# Patient Record
Sex: Female | Born: 1945 | Race: White | Hispanic: No | Marital: Single | Smoking: Current every day smoker
Health system: Southern US, Community
[De-identification: ages and names within clinical notes are randomized; demographics above are authoritative.]

## PROBLEM LIST (undated history)

## (undated) DIAGNOSIS — I10 Essential (primary) hypertension: Secondary | ICD-10-CM

## (undated) HISTORY — PX: TONSILLECTOMY: SUR1361

---

## 1976-09-28 DIAGNOSIS — G459 Transient cerebral ischemic attack, unspecified: Secondary | ICD-10-CM

## 1976-09-28 HISTORY — DX: Transient cerebral ischemic attack, unspecified: G45.9

## 2020-04-21 ENCOUNTER — Other Ambulatory Visit: Payer: Self-pay | Admitting: Infectious Diseases

## 2020-04-21 ENCOUNTER — Other Ambulatory Visit: Payer: Self-pay

## 2020-04-21 ENCOUNTER — Ambulatory Visit
Admission: RE | Admit: 2020-04-21 | Discharge: 2020-04-21 | Disposition: A | Discharge status: Home / Self Care | Payer: Medicare Other | Source: Ambulatory Visit | Attending: Infectious Diseases | Admitting: Infectious Diseases

## 2020-04-21 DIAGNOSIS — R1013 Epigastric pain: Secondary | ICD-10-CM

## 2020-04-21 DIAGNOSIS — R634 Abnormal weight loss: Secondary | ICD-10-CM | POA: Diagnosis not present

## 2020-04-21 DIAGNOSIS — I2510 Atherosclerotic heart disease of native coronary artery without angina pectoris: Secondary | ICD-10-CM | POA: Diagnosis not present

## 2020-04-21 DIAGNOSIS — I708 Atherosclerosis of other arteries: Secondary | ICD-10-CM | POA: Diagnosis not present

## 2020-04-21 DIAGNOSIS — R413 Other amnesia: Secondary | ICD-10-CM | POA: Diagnosis not present

## 2020-04-21 DIAGNOSIS — I700 Atherosclerosis of aorta: Secondary | ICD-10-CM | POA: Diagnosis not present

## 2020-04-21 DIAGNOSIS — M4319 Spondylolisthesis, multiple sites in spine: Secondary | ICD-10-CM | POA: Diagnosis not present

## 2020-04-21 DIAGNOSIS — J439 Emphysema, unspecified: Secondary | ICD-10-CM | POA: Diagnosis not present

## 2020-04-21 DIAGNOSIS — I10 Essential (primary) hypertension: Secondary | ICD-10-CM | POA: Diagnosis not present

## 2020-04-21 DIAGNOSIS — R9389 Abnormal findings on diagnostic imaging of other specified body structures: Secondary | ICD-10-CM

## 2020-04-21 DIAGNOSIS — R109 Unspecified abdominal pain: Secondary | ICD-10-CM | POA: Diagnosis not present

## 2020-04-21 DIAGNOSIS — F17200 Nicotine dependence, unspecified, uncomplicated: Secondary | ICD-10-CM | POA: Diagnosis not present

## 2020-04-21 DIAGNOSIS — M160 Bilateral primary osteoarthritis of hip: Secondary | ICD-10-CM | POA: Diagnosis not present

## 2020-04-21 DIAGNOSIS — I77810 Thoracic aortic ectasia: Secondary | ICD-10-CM | POA: Diagnosis not present

## 2020-04-21 DIAGNOSIS — I723 Aneurysm of iliac artery: Secondary | ICD-10-CM | POA: Diagnosis not present

## 2020-04-21 LAB — POCT I-STAT CREATININE: Creatinine, Ser: 0.9 mg/dL (ref 0.44–1.00)

## 2020-04-21 MED ORDER — IOHEXOL 300 MG/ML  SOLN
75.0000 mL | Freq: Once | INTRAMUSCULAR | Status: AC | PRN
  Administered 2020-04-21: 75 mL via INTRAVENOUS

## 2020-04-26 DIAGNOSIS — Z1231 Encounter for screening mammogram for malignant neoplasm of breast: Secondary | ICD-10-CM | POA: Diagnosis not present

## 2020-04-26 DIAGNOSIS — Z1211 Encounter for screening for malignant neoplasm of colon: Secondary | ICD-10-CM | POA: Diagnosis not present

## 2020-04-26 DIAGNOSIS — R634 Abnormal weight loss: Secondary | ICD-10-CM | POA: Diagnosis not present

## 2020-04-26 DIAGNOSIS — D71 Functional disorders of polymorphonuclear neutrophils: Secondary | ICD-10-CM | POA: Diagnosis not present

## 2020-04-26 DIAGNOSIS — R413 Other amnesia: Secondary | ICD-10-CM | POA: Diagnosis not present

## 2020-04-26 DIAGNOSIS — I10 Essential (primary) hypertension: Secondary | ICD-10-CM | POA: Diagnosis not present

## 2020-04-27 ENCOUNTER — Other Ambulatory Visit: Payer: Self-pay | Admitting: Infectious Diseases

## 2020-04-27 DIAGNOSIS — I10 Essential (primary) hypertension: Secondary | ICD-10-CM

## 2020-04-27 DIAGNOSIS — R413 Other amnesia: Secondary | ICD-10-CM

## 2020-04-27 DIAGNOSIS — Z1231 Encounter for screening mammogram for malignant neoplasm of breast: Secondary | ICD-10-CM

## 2020-05-11 ENCOUNTER — Ambulatory Visit
Admission: RE | Admit: 2020-05-11 | Discharge: 2020-05-11 | Disposition: A | Discharge status: Home / Self Care | Payer: Medicare Other | Source: Ambulatory Visit | Attending: Infectious Diseases | Admitting: Infectious Diseases

## 2020-05-11 ENCOUNTER — Other Ambulatory Visit: Payer: Self-pay

## 2020-05-11 DIAGNOSIS — R413 Other amnesia: Secondary | ICD-10-CM

## 2020-05-11 DIAGNOSIS — I10 Essential (primary) hypertension: Secondary | ICD-10-CM

## 2020-05-11 DIAGNOSIS — R42 Dizziness and giddiness: Secondary | ICD-10-CM | POA: Diagnosis not present

## 2020-05-13 DIAGNOSIS — R413 Other amnesia: Secondary | ICD-10-CM | POA: Diagnosis not present

## 2020-05-13 DIAGNOSIS — J449 Chronic obstructive pulmonary disease, unspecified: Secondary | ICD-10-CM | POA: Diagnosis not present

## 2020-05-13 DIAGNOSIS — I10 Essential (primary) hypertension: Secondary | ICD-10-CM | POA: Diagnosis not present

## 2020-05-13 DIAGNOSIS — R634 Abnormal weight loss: Secondary | ICD-10-CM | POA: Diagnosis not present

## 2020-06-14 ENCOUNTER — Other Ambulatory Visit (HOSPITAL_COMMUNITY): Payer: Self-pay | Admitting: *Deleted

## 2020-08-06 ENCOUNTER — Other Ambulatory Visit
Admission: RE | Admit: 2020-08-06 | Discharge: 2020-08-06 | Disposition: A | Discharge status: Home / Self Care | Payer: Medicare Other | Source: Ambulatory Visit | Attending: Gastroenterology | Admitting: Gastroenterology

## 2020-08-06 DIAGNOSIS — Z20822 Contact with and (suspected) exposure to covid-19: Secondary | ICD-10-CM | POA: Diagnosis not present

## 2020-08-06 DIAGNOSIS — Z01812 Encounter for preprocedural laboratory examination: Secondary | ICD-10-CM | POA: Insufficient documentation

## 2020-08-07 LAB — SARS CORONAVIRUS 2 (TAT 6-24 HRS): SARS Coronavirus 2: NEGATIVE

## 2020-08-09 ENCOUNTER — Encounter: Payer: Self-pay | Admitting: *Deleted

## 2020-08-10 ENCOUNTER — Ambulatory Visit: Payer: Medicare Other | Admitting: Anesthesiology

## 2020-08-10 ENCOUNTER — Encounter: Payer: Self-pay | Admitting: *Deleted

## 2020-08-10 ENCOUNTER — Ambulatory Visit
Admission: RE | Admit: 2020-08-10 | Discharge: 2020-08-10 | Disposition: A | Discharge status: Home / Self Care | Payer: Medicare Other | Attending: Gastroenterology | Admitting: Gastroenterology

## 2020-08-10 ENCOUNTER — Encounter
Admission: RE | Disposition: A | Discharge status: Home / Self Care | Payer: Self-pay | Source: Home / Self Care | Attending: Gastroenterology

## 2020-08-10 ENCOUNTER — Other Ambulatory Visit: Payer: Self-pay

## 2020-08-10 DIAGNOSIS — Z8673 Personal history of transient ischemic attack (TIA), and cerebral infarction without residual deficits: Secondary | ICD-10-CM | POA: Diagnosis not present

## 2020-08-10 DIAGNOSIS — F17200 Nicotine dependence, unspecified, uncomplicated: Secondary | ICD-10-CM | POA: Diagnosis not present

## 2020-08-10 DIAGNOSIS — Z7982 Long term (current) use of aspirin: Secondary | ICD-10-CM | POA: Diagnosis not present

## 2020-08-10 DIAGNOSIS — K621 Rectal polyp: Secondary | ICD-10-CM | POA: Insufficient documentation

## 2020-08-10 DIAGNOSIS — I10 Essential (primary) hypertension: Secondary | ICD-10-CM | POA: Diagnosis not present

## 2020-08-10 DIAGNOSIS — Z79899 Other long term (current) drug therapy: Secondary | ICD-10-CM | POA: Diagnosis not present

## 2020-08-10 DIAGNOSIS — K5730 Diverticulosis of large intestine without perforation or abscess without bleeding: Secondary | ICD-10-CM | POA: Insufficient documentation

## 2020-08-10 DIAGNOSIS — R195 Other fecal abnormalities: Secondary | ICD-10-CM | POA: Diagnosis present

## 2020-08-10 DIAGNOSIS — D125 Benign neoplasm of sigmoid colon: Secondary | ICD-10-CM | POA: Diagnosis not present

## 2020-08-10 HISTORY — DX: Essential (primary) hypertension: I10

## 2020-08-10 HISTORY — PX: COLONOSCOPY WITH PROPOFOL: SHX5780

## 2020-08-10 SURGERY — COLONOSCOPY WITH PROPOFOL
Anesthesia: General

## 2020-08-10 MED ORDER — PROPOFOL 10 MG/ML IV BOLUS
INTRAVENOUS | Status: DC | PRN
  Administered 2020-08-10: 40 mg via INTRAVENOUS
  Administered 2020-08-10: 20 mg via INTRAVENOUS
  Administered 2020-08-10: 10 mg via INTRAVENOUS

## 2020-08-10 MED ORDER — LIDOCAINE 2% (20 MG/ML) 5 ML SYRINGE
INTRAMUSCULAR | Status: DC | PRN
  Administered 2020-08-10: 80 mg via INTRAVENOUS

## 2020-08-10 MED ORDER — SODIUM CHLORIDE 0.9 % IV SOLN: INTRAVENOUS | Status: DC

## 2020-08-10 MED ORDER — PROPOFOL 500 MG/50ML IV EMUL
INTRAVENOUS | Status: DC | PRN
  Administered 2020-08-10: 180 ug/kg/min via INTRAVENOUS

## 2020-08-10 MED ORDER — LIDOCAINE HCL (PF) 2 % IJ SOLN
INTRAMUSCULAR | Status: AC
  Filled 2020-08-10: qty 5

## 2020-08-10 MED ORDER — PROPOFOL 500 MG/50ML IV EMUL
INTRAVENOUS | Status: AC
  Filled 2020-08-10: qty 50

## 2020-08-10 MED ORDER — EPHEDRINE SULFATE 50 MG/ML IJ SOLN
INTRAMUSCULAR | Status: DC | PRN
  Administered 2020-08-10: 10 mg via INTRAVENOUS

## 2020-08-10 NOTE — Interval H&P Note (Signed)
History and Physical Interval Note:  08/10/2020 10:36 AM  Miranda Bond  has presented today for surgery, with the diagnosis of POSITIVE COLOGUARD.  The various methods of treatment have been discussed with the patient and family. After consideration of risks, benefits and other options for treatment, the patient has consented to  Procedure(s): COLONOSCOPY WITH PROPOFOL (N/A) ESOPHAGOGASTRODUODENOSCOPY (EGD) WITH PROPOFOL (N/A) as a surgical intervention.  The patient's history has been reviewed, patient examined, no change in status, stable for surgery.  I have reviewed the patient's chart and labs.  Questions were answered to the patient's satisfaction.     [name]  Ok to proceed with colonoscopy

## 2020-08-10 NOTE — Anesthesia Postprocedure Evaluation (Signed)
Anesthesia Post Note  Patient: Miranda Bond  Procedure(s) Performed: COLONOSCOPY WITH PROPOFOL (N/A )  Patient location during evaluation: Endoscopy Anesthesia Type: General Level of consciousness: awake and alert Pain management: pain level controlled Vital Signs Assessment: post-procedure vital signs reviewed and stable Respiratory status: spontaneous breathing and respiratory function stable Cardiovascular status: stable Anesthetic complications: no   No complications documented.   Last Vitals:  Vitals:   08/10/20 1011 08/10/20 1149  BP: (!) 183/93 (!) 92/58  Pulse: 78 66  Resp: 16 16  Temp: (!) 36.4 C (!) 35.8 C  SpO2: 100% 99%    Last Pain:  Vitals:   08/10/20 1149  TempSrc: Temporal  PainSc: Asleep                 Athleen Feltner K

## 2020-08-10 NOTE — OR Nursing (Signed)
2 Polyps cold snared in Sigmoid colon, only one polyp retrieved.

## 2020-08-10 NOTE — H&P (Signed)
Outpatient short stay form Pre-procedure 08/10/2020 10:33 AM Raylene Miyamoto MD, MPH  Primary Physician: Dr. Ola Spurr  Reason for visit:  Positive cologuard  History of present illness:   75 y/o lady with history of hypertension and positive cologuard here for colonoscopy. Patient was originally scheduled for EGD/Colon. But patient has refused the EGD this morning. No abdominal surgeries or blood thinners. History of some granulomatous disease.    Current Facility-Administered Medications:  .  0.9 %  sodium chloride infusion, , Intravenous, Continuous, Samik Balkcom, Hilton Cork, MD  Medications Prior to Admission  Medication Sig Dispense Refill Last Dose  . Acidophilus Lactobacillus CAPS Take 1 capsule by mouth daily.   Past Week at Unknown time  . amLODipine (NORVASC) 5 MG tablet Take 7.5 mg by mouth daily. Take 1.5 tablets   08/09/2020 at Unknown time  . Ascorbic Acid (VITAMIN C) 1000 MG tablet Take 1,000 mg by mouth daily.   Past Week at Unknown time  . aspirin 325 MG tablet Take 325 mg by mouth daily.   Past Week at Unknown time  . atenolol (TENORMIN) 25 MG tablet Take 25 mg by mouth daily.   08/09/2020 at Unknown time  . Glucosamine-Chondroitin (GLUCOSAMINE CHONDR COMPLEX PO) Take 1 capsule by mouth daily.     Marland Kitchen losartan (COZAAR) 25 MG tablet Take 25 mg by mouth daily.   08/09/2020 at Unknown time  . Omega-3 1000 MG CAPS Take 1,000 mg by mouth daily.   Past Week at Unknown time  . sennosides-docusate sodium (SENOKOT-S) 8.6-50 MG tablet Take 2 tablets by mouth daily.        No Known Allergies   Past Medical History:  Diagnosis Date  . Hypertension   . TIA (transient ischemic attack) 09/1976    Review of systems:  Otherwise negative.    Physical Exam  Gen: Alert, oriented. Appears stated age.  HEENT: PERRLA. Lungs: No respiratory distress CV: RRR Abd: soft, benign, no masses Ext: No edema    Planned procedures: Proceed with colonoscopy. The patient understands the  nature of the planned procedure, indications, risks, alternatives and potential complications including but not limited to bleeding, infection, perforation, damage to internal organs and possible oversedation/side effects from anesthesia. The patient agrees and gives consent to proceed.  Please refer to procedure notes for findings, recommendations and patient disposition/instructions.     Raylene Miyamoto MD, MPH Gastroenterology 08/10/2020  10:33 AM

## 2020-08-10 NOTE — Transfer of Care (Signed)
Immediate Anesthesia Transfer of Care Note  Patient: Miranda Bond  Procedure(s) Performed: COLONOSCOPY WITH PROPOFOL (N/A )  Patient Location: PACU  Anesthesia Type:General  Level of Consciousness: awake, alert  and oriented  Airway & Oxygen Therapy: Patient Spontanous Breathing and Patient connected to nasal cannula oxygen  Post-op Assessment: Report given to RN and Post -op Vital signs reviewed and stable  Post vital signs: Reviewed and stable  Last Vitals:  Vitals Value Taken Time  BP 92/58 08/10/20 1150  Temp 35.8 C 08/10/20 1149  Pulse 65 08/10/20 1151  Resp 18 08/10/20 1151  SpO2 99 % 08/10/20 1151  Vitals shown include unvalidated device data.  Last Pain:  Vitals:   08/10/20 1149  TempSrc: Temporal  PainSc: Asleep         Complications: No complications documented.

## 2020-08-10 NOTE — Anesthesia Preprocedure Evaluation (Signed)
Anesthesia Evaluation  Patient identified by MRN, date of birth, ID band Patient awake    Reviewed: Allergy & Precautions, NPO status , Patient's Chart, lab work & pertinent test results  History of Anesthesia Complications Negative for: history of anesthetic complications  Airway Mallampati: II       Dental   Pulmonary neg sleep apnea, neg COPD, Current Smoker and Patient abstained from smoking.,           Cardiovascular hypertension, Pt. on medications (-) Past MI and (-) CHF (-) dysrhythmias (-) Valvular Problems/Murmurs     Neuro/Psych neg Seizures TIA (40 yrs ago, no further difficulties)   GI/Hepatic Neg liver ROS, neg GERD  ,  Endo/Other  neg diabetes  Renal/GU negative Renal ROS     Musculoskeletal   Abdominal   Peds  Hematology   Anesthesia Other Findings   Reproductive/Obstetrics                             Anesthesia Physical Anesthesia Plan  ASA: II  Anesthesia Plan: General   Post-op Pain Management:    Induction: Intravenous  PONV Risk Score and Plan: 2 and Propofol infusion and TIVA  Airway Management Planned: Nasal Cannula  Additional Equipment:   Intra-op Plan:   Post-operative Plan:   Informed Consent: I have reviewed the patients History and Physical, chart, labs and discussed the procedure including the risks, benefits and alternatives for the proposed anesthesia with the patient or authorized representative who has indicated his/her understanding and acceptance.       Plan Discussed with:   Anesthesia Plan Comments:         Anesthesia Quick Evaluation

## 2020-08-10 NOTE — Anesthesia Procedure Notes (Signed)
Date/Time: 08/10/2020 10:55 AM Performed by: Allean Found, CRNA Pre-anesthesia Checklist: Patient identified, Emergency Drugs available, Suction available, Patient being monitored and Timeout performed Oxygen Delivery Method: Nasal cannula Placement Confirmation: positive ETCO2

## 2020-08-10 NOTE — Op Note (Signed)
[facility] Gastroenterology Patient Name: Miranda Bond Procedure Date: 08/10/2020 10:45 AM MRN: [id number] Account #: [id number] Date of Birth: [birth year] Admit Type: Outpatient Age: 75 Room: [facility] ENDO ROOM 1 Gender: Female Note Status: Finalized Procedure:             Colonoscopy Indications:           Positive Cologuard test Providers:             Andrey Farmer MD, MD Referring MD:          Adrian Prows (Referring MD) Medicines:             Monitored Anesthesia Care Complications:         No immediate complications. Estimated blood loss:                         Minimal. Procedure:             Pre-Anesthesia Assessment:                        - Prior to the procedure, a History and Physical was                         performed, and patient medications and allergies were                         reviewed. The patient is competent. The risks and                         benefits of the procedure and the sedation options and                         risks were discussed with the patient. All questions                         were answered and informed consent was obtained.                         Patient identification and proposed procedure were                         verified by the physician, the nurse, the anesthetist                         and the technician in the endoscopy suite. Mental                         Status Examination: alert and oriented. Airway                         Examination: normal oropharyngeal airway and neck                         mobility. Respiratory Examination: clear to                         auscultation. CV Examination: normal. Prophylactic                         Antibiotics: The patient  does not require prophylactic                         antibiotics. Prior Anticoagulants: The patient has                         taken no previous anticoagulant or antiplatelet                         agents. ASA Grade Assessment: II - A  patient with mild                         systemic disease. After reviewing the risks and                         benefits, the patient was deemed in satisfactory                         condition to undergo the procedure. The anesthesia                         plan was to use monitored anesthesia care (MAC).                         Immediately prior to administration of medications,                         the patient was re-assessed for adequacy to receive                         sedatives. The heart rate, respiratory rate, oxygen                         saturations, blood pressure, adequacy of pulmonary                         ventilation, and response to care were monitored                         throughout the procedure. The physical status of the                         patient was re-assessed after the procedure.                        After obtaining informed consent, the colonoscope was                         passed under direct vision. Throughout the procedure,                         the patient's blood pressure, pulse, and oxygen                         saturations were monitored continuously. The                         Colonoscope was introduced through the anus and  advanced to the the cecum, identified by appendiceal                         orifice and ileocecal valve. The colonoscopy was                         somewhat difficult due to multiple diverticula in the                         colon. The patient tolerated the procedure well. The                         quality of the bowel preparation was good. Findings:      The perianal and digital rectal examinations were normal.      Two sessile polyps were found in the sigmoid colon. The polyps were 3 to       4 mm in size. These polyps were removed with a cold snare. Resection and       retrieval were complete. One polyp unable to be retrieved. Estimated       blood loss was minimal.      A 10 mm  polyp was found in the recto-sigmoid colon. The polyp was       sessile. The polyp was removed with a hot snare. The polyp was removed       with a piecemeal technique using a hot snare. Resection and retrieval       were complete. Estimated blood loss was minimal.      Many small-mouthed diverticula were found in the sigmoid colon,       descending colon, transverse colon, hepatic flexure and ascending colon.      The exam was otherwise without abnormality on direct and retroflexion       views. Impression:            - Two 3 to 4 mm polyps in the sigmoid colon, removed                         with a cold snare. Resected and retrieved. One unable                         to be retrieved.                        - One 10 mm polyp at the recto-sigmoid colon, removed                         with a hot snare and removed piecemeal using a hot                         snare. Resected and retrieved.                        - Diverticulosis in the sigmoid colon, in the                         descending colon, in the transverse colon, at the                         hepatic flexure  and in the ascending colon.                        - The examination was otherwise normal on direct and                         retroflexion views. Recommendation:        - Discharge patient to home.                        - Resume previous diet.                        - Continue present medications.                        - Await pathology results.                        - Repeat colonoscopy for surveillance based on                         pathology results.                        - Return to referring physician as previously                         scheduled. Procedure Code(s):     --- Professional ---                        [phone], Colonoscopy, flexible; with removal of                         tumor(s), polyp(s), or other lesion(s) by snare                         technique Diagnosis Code(s):     --- Professional  ---                        K63.5, Polyp of colon                        R19.5, Other fecal abnormalities                        K57.30, Diverticulosis of large intestine without                         perforation or abscess without bleeding CPT copyright 2019 [employer]. All rights reserved. The codes documented in this report are preliminary and upon coder review may  be revised to meet current compliance requirements. Andrey Farmer MD, MD 08/10/2020 11:47:30 AM Number of Addenda: 0 Note Initiated On: 08/10/2020 10:45 AM Scope Withdrawal Time: 0 hours 34 minutes 2 seconds  Total Procedure Duration: 0 hours 43 minutes 10 seconds  Estimated Blood Loss:  Estimated blood loss was minimal.      [facility]

## 2020-08-11 ENCOUNTER — Encounter: Payer: Self-pay | Admitting: Gastroenterology

## 2020-08-11 LAB — SURGICAL PATHOLOGY

## 2021-02-01 ENCOUNTER — Other Ambulatory Visit: Payer: Self-pay | Admitting: Infectious Diseases

## 2021-02-01 ENCOUNTER — Other Ambulatory Visit (HOSPITAL_COMMUNITY): Payer: Self-pay | Admitting: Infectious Diseases

## 2021-02-01 DIAGNOSIS — N2889 Other specified disorders of kidney and ureter: Secondary | ICD-10-CM

## 2021-02-15 ENCOUNTER — Ambulatory Visit
Admission: RE | Admit: 2021-02-15 | Discharge: 2021-02-15 | Disposition: A | Discharge status: Home / Self Care | Payer: Medicare Other | Source: Ambulatory Visit | Attending: Infectious Diseases | Admitting: Infectious Diseases

## 2021-02-15 ENCOUNTER — Other Ambulatory Visit: Payer: Self-pay

## 2021-02-15 DIAGNOSIS — N2889 Other specified disorders of kidney and ureter: Secondary | ICD-10-CM | POA: Diagnosis present

## 2021-02-15 MED ORDER — GADOBUTROL 1 MMOL/ML IV SOLN
5.0000 mL | Freq: Once | INTRAVENOUS | Status: AC | PRN
  Administered 2021-02-15: 5 mL via INTRAVENOUS

## 2021-03-01 ENCOUNTER — Ambulatory Visit: Payer: Medicare Other

## 2023-06-01 IMAGING — MR MR ABDOMEN WO/W CM
18 series · 48 of 48 positions shown · IV contrast (5ml Gadavist)
Comparison: CT scan 04/21/2020

CLINICAL DATA: Follow-up left renal lesions seen on prior CT scan.

EXAM:
MRI ABDOMEN WITHOUT AND WITH CONTRAST
TECHNIQUE: Multiplanar multisequence MR imaging of the abdomen was performed
both before and after the administration of intravenous contrast.
CONTRAST:  5mL GADAVIST GADOBUTROL 1 MMOL/ML IV SOLN

[Series 3: cor haste · coronal · 6.0mm · 1.19mm/px · 2 of 31 slices shown]
[im 1/31]
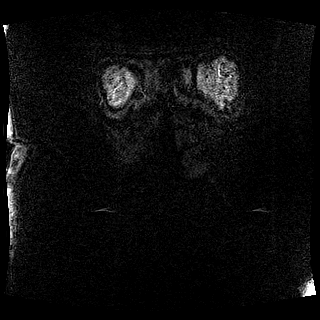
[im 31/31]
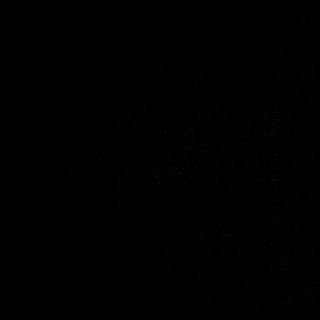

[Series 6: T2 fat-sat · axial · 6.0mm · 1.19mm/px · z∈[-138,+85]mm · 2 of 32 slices shown]
[im 1/32]
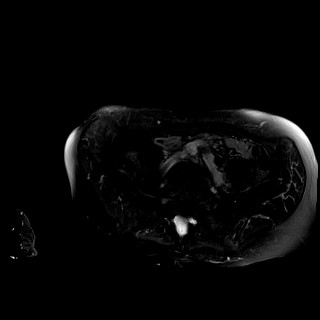
[im 32/32]
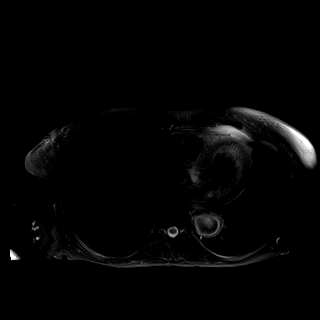

[Series 7: DWI · axial · 6.0mm · 1.42mm/px · z∈[-138,+85]mm · 5 of 96 slices shown (1 of 2)]
[im 1/96]
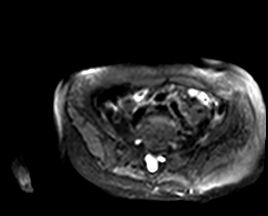
[im 24/96]
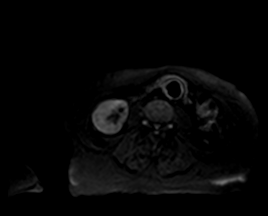
[im 48/96]
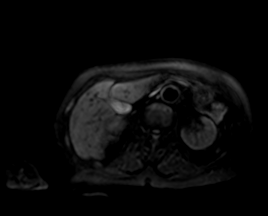
[im 72/96]
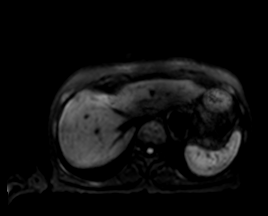
[im 96/96]
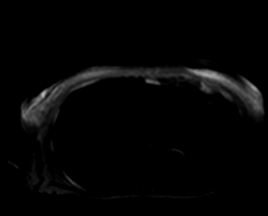

[Series 8: DWI · axial · 6.0mm · 1.42mm/px · 1 of 32 slices shown (2 of 2)]
[im 1/32]
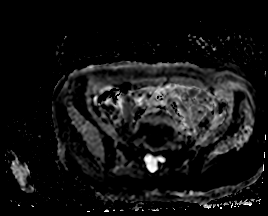

[Series 9: in & out · axial · 3.0mm · 1.19mm/px · z∈[-133,+80]mm · 3 of 72 slices shown (1 of 2)]
[im 1/72]
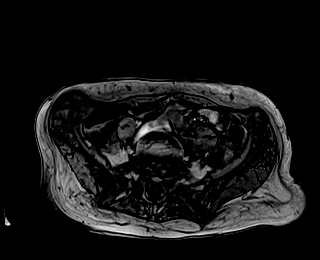
[im 36/72]
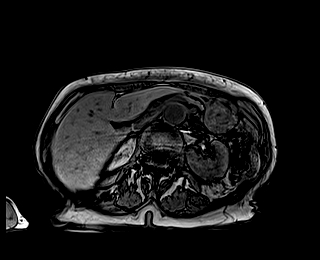
[im 72/72]
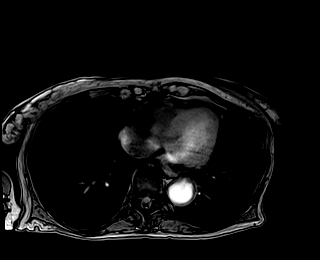

[Series 10: in & out · axial · 3.0mm · 1.19mm/px · z∈[-133,+80]mm · 3 of 72 slices shown (2 of 2)]
[im 1/72]
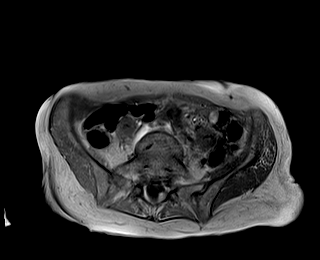
[im 36/72]
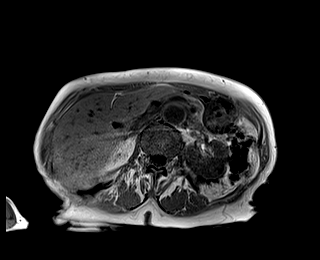
[im 72/72]
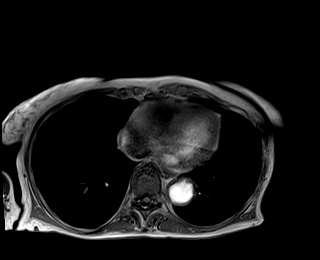

[Series 11: bSSFP · axial · 6.0mm · 0.74mm/px · 1 of 32 slices shown]
[im 1/32]
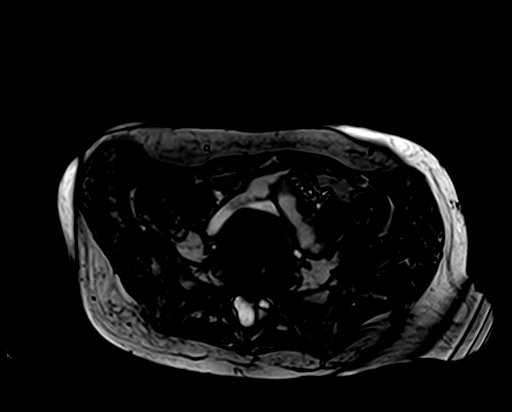

[Series 12: T2 · axial · 6.0mm · 1.19mm/px · 1 of 32 slices shown]
[im 1/32]
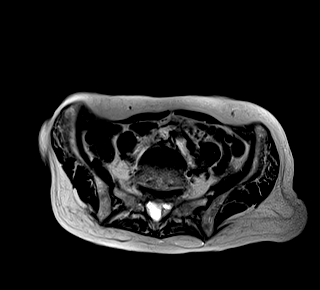

[Series 13: T1 dynamic · axial · non-contrast · 3.0mm · 1.19mm/px · z∈[-133,+80]mm · 3 of 72 slices shown (1 of 9)]
[im 1/72]
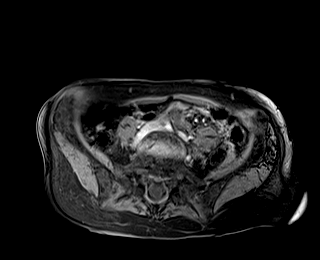
[im 36/72]
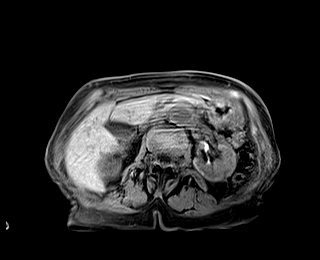
[im 72/72]
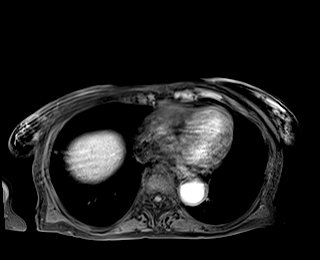

[Series 14: T1 dynamic · axial · 3.0mm · 1.19mm/px · z∈[-133,+80]mm · 3 of 72 slices shown (2 of 9)]
[im 1/72]
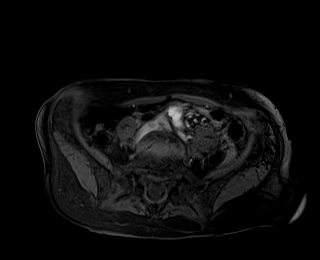
[im 36/72]
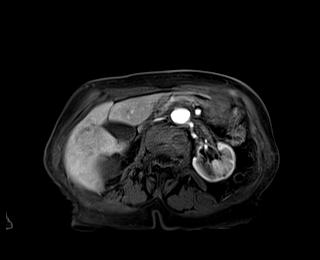
[im 72/72]
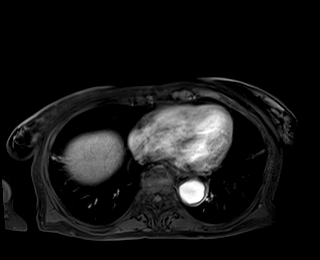

[Series 15: T1 dynamic · axial · 3.0mm · 1.19mm/px · z∈[-133,+80]mm · 3 of 72 slices shown (3 of 9)]
[im 1/72]
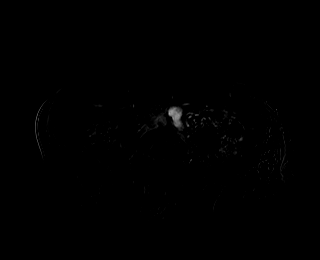
[im 36/72]
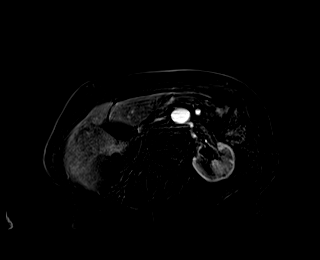
[im 72/72]
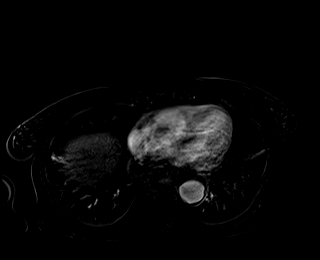

[Series 16: T1 dynamic · axial · 3.0mm · 1.19mm/px · z∈[-133,+80]mm · 3 of 72 slices shown (4 of 9)]
[im 1/72]
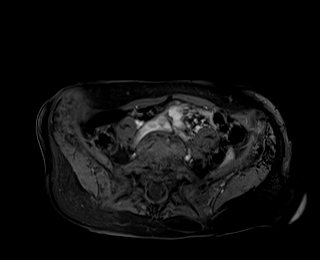
[im 36/72]
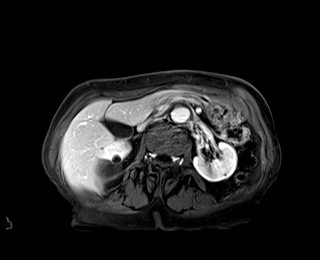
[im 72/72]
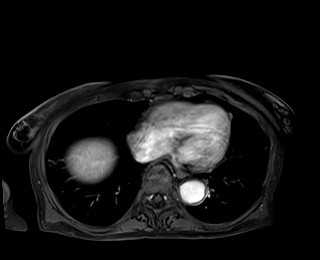

[Series 17: T1 dynamic · axial · 3.0mm · 1.19mm/px · z∈[-133,+80]mm · 3 of 72 slices shown (5 of 9)]
[im 1/72]
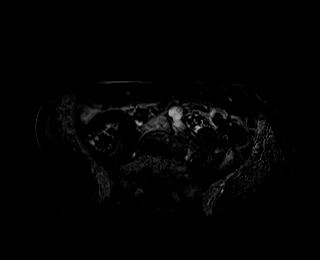
[im 36/72]
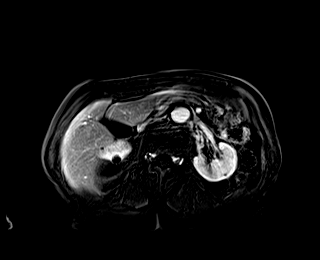
[im 72/72]
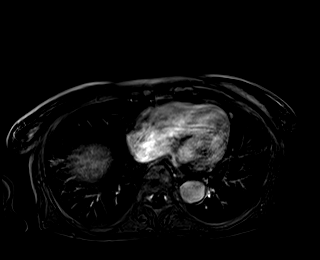

[Series 18: T1 dynamic · axial · 3.0mm · 1.19mm/px · z∈[-133,+80]mm · 3 of 72 slices shown (6 of 9)]
[im 1/72]
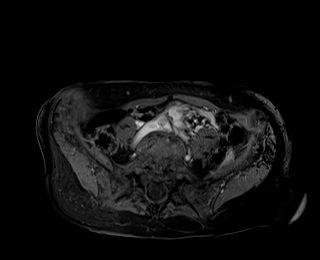
[im 36/72]
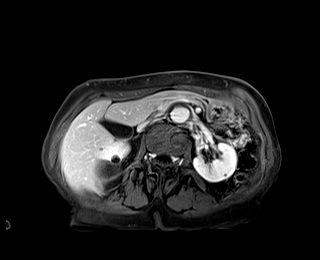
[im 72/72]
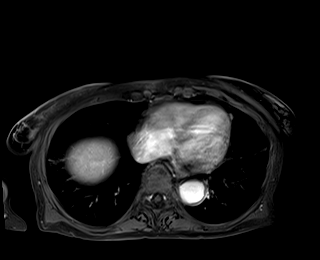

[Series 19: T1 dynamic · axial · 3.0mm · 1.19mm/px · z∈[-133,+80]mm · 3 of 72 slices shown (7 of 9)]
[im 1/72]
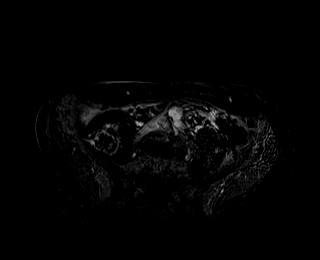
[im 36/72]
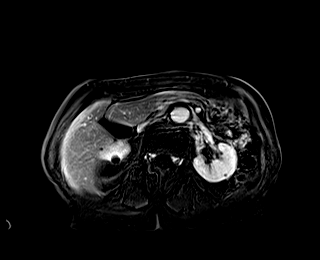
[im 72/72]
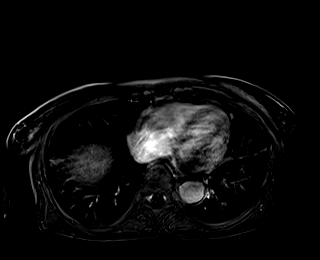

[Series 20: T1 dynamic post-contrast · coronal · 3.0mm · 1.31mm/px · 3 of 80 slices shown]
[im 1/80]
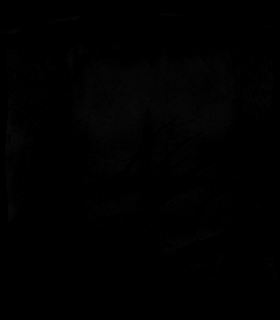
[im 40/80]
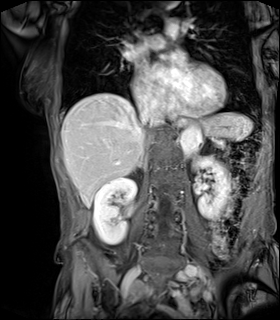
[im 80/80]
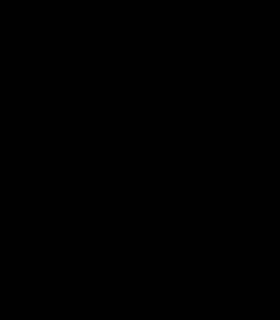

[Series 21: T1 dynamic · axial · 3.0mm · 1.19mm/px · z∈[-133,+80]mm · 3 of 72 slices shown (8 of 9)]
[im 1/72]
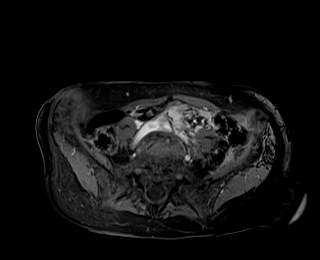
[im 36/72]
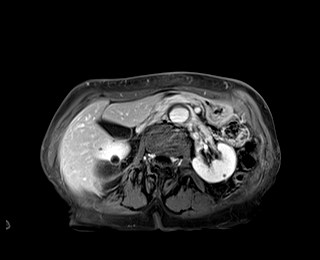
[im 72/72]
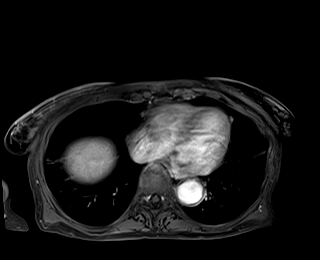

[Series 22: T1 dynamic · axial · 3.0mm · 1.19mm/px · z∈[-133,+80]mm · 3 of 72 slices shown (9 of 9)]
[im 1/72]
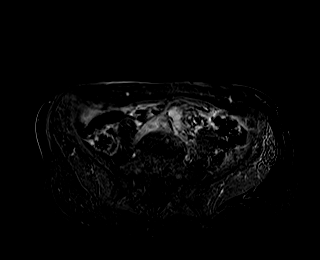
[im 36/72]
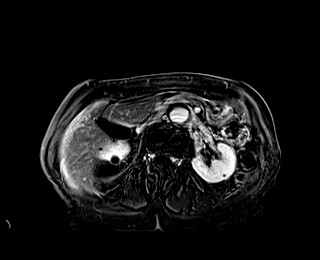
[im 72/72]
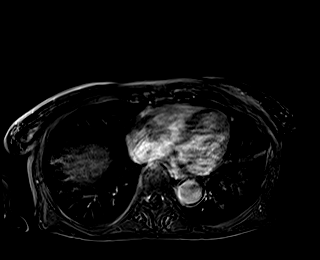

[48 of 48 positions shown; findings below may reference images not displayed]

FINDINGS: Lower chest: The lung bases are clear of an acute process. No
worrisome pulmonary lesions, pleural effusion or pericardial
effusion.

Hepatobiliary: There are numerous small scattered hepatic cysts
along with scattered calcified granulomas. No worrisome hepatic
lesions. The gallbladder is unremarkable. No common bile duct
dilatation.

Pancreas: No mass, inflammation or ductal dilatation. Moderate
pancreatic atrophy.

Spleen: Normal size. No focal lesions. Diffuse calcified granulomas.

Adrenals/Urinary Tract:  The adrenal glands are unremarkable.

Numerous bilateral renal cysts are noted. Lower pole left renal cyst
contains a small amount layering hemorrhage. No worrisome enhancing
renal lesions are identified.

Stomach/Bowel: The stomach, duodenum, visualized small bowel and
visualized colon are grossly normal.

Vascular/Lymphatic: Stable advanced atherosclerotic disease
involving the abdominal aorta and iliac arteries. No mesenteric or
retroperitoneal mass or adenopathy.

Other:  No ascites or abdominal wall hernia.

Musculoskeletal: No significant bony findings.
IMPRESSION: 1. Numerous bilateral renal cysts. No worrisome enhancing renal
lesions.
2. Stable advanced atherosclerotic disease involving the abdominal
aorta and iliac arteries.
3. Stable hepatic cysts and hepatic and splenic calcified
granulomas.
4. Aortic atherosclerosis.

Aortic Atherosclerosis ([id number]).
